# Patient Record
Sex: Female | Born: 1970 | Race: Black or African American | Hispanic: No | Marital: Single | Smoking: Former smoker
Health system: Southern US, Community
[De-identification: ages and names within clinical notes are randomized; demographics above are authoritative.]

## PROBLEM LIST (undated history)

## (undated) DIAGNOSIS — I10 Essential (primary) hypertension: Secondary | ICD-10-CM

---

## 1999-04-02 ENCOUNTER — Other Ambulatory Visit: Admission: RE | Admit: 1999-04-02 | Discharge: 1999-04-02 | Payer: Self-pay | Admitting: Obstetrics and Gynecology

## 2000-03-23 ENCOUNTER — Other Ambulatory Visit: Admission: RE | Admit: 2000-03-23 | Discharge: 2000-03-23 | Payer: Self-pay | Admitting: Obstetrics and Gynecology

## 2000-10-08 ENCOUNTER — Other Ambulatory Visit: Admission: RE | Admit: 2000-10-08 | Discharge: 2000-10-08 | Payer: Self-pay | Admitting: Obstetrics and Gynecology

## 2000-10-08 ENCOUNTER — Encounter (INDEPENDENT_AMBULATORY_CARE_PROVIDER_SITE_OTHER): Payer: Self-pay | Admitting: Specialist

## 2001-04-14 ENCOUNTER — Other Ambulatory Visit: Admission: RE | Admit: 2001-04-14 | Discharge: 2001-04-14 | Payer: Self-pay | Admitting: *Deleted

## 2002-05-27 ENCOUNTER — Other Ambulatory Visit: Admission: RE | Admit: 2002-05-27 | Discharge: 2002-05-27 | Payer: Self-pay | Admitting: *Deleted

## 2003-05-08 ENCOUNTER — Other Ambulatory Visit: Admission: RE | Admit: 2003-05-08 | Discharge: 2003-05-08 | Payer: Self-pay | Admitting: Obstetrics and Gynecology

## 2006-07-29 ENCOUNTER — Ambulatory Visit: Payer: Self-pay | Admitting: Internal Medicine

## 2006-07-29 LAB — CONVERTED CEMR LAB
Basophils Absolute: 0 10*3/uL (ref 0.0–0.1)
Basophils Relative: 0.3 % (ref 0.0–1.0)
Eosinophil percent: 2 % (ref 0.0–5.0)
Folate: >20 ng/mL
HCT: 41.2 % (ref 36.0–46.0)
Hemoglobin: 13.7 g/dL (ref 12.0–15.0)
Lymphocytes Relative: 36.3 % (ref 12.0–46.0)
MCHC: 33.2 g/dL (ref 30.0–36.0)
MCV: 91.7 fL (ref 78.0–100.0)
Monocytes Absolute: 0.6 10*3/uL (ref 0.2–0.7)
Monocytes Relative: 8.7 % (ref 3.0–11.0)
Neutro Abs: 4 10*3/uL (ref 1.4–7.7)
Neutrophils Relative %: 52.7 % (ref 43.0–77.0)
Platelets: 202 10*3/uL (ref 150–400)
RBC: 4.49 M/uL (ref 3.87–5.11)
RDW: 12.5 % (ref 11.5–14.6)
Vitamin B-12: 362 pg/mL (ref 211–911)
WBC: 7.3 10*3/uL (ref 4.5–10.5)

## 2006-09-09 ENCOUNTER — Ambulatory Visit: Payer: Self-pay | Admitting: Internal Medicine

## 2007-08-10 DIAGNOSIS — I10 Essential (primary) hypertension: Secondary | ICD-10-CM | POA: Insufficient documentation

## 2007-08-10 DIAGNOSIS — R209 Unspecified disturbances of skin sensation: Secondary | ICD-10-CM | POA: Insufficient documentation

## 2008-07-10 ENCOUNTER — Encounter: Admission: RE | Admit: 2008-07-10 | Discharge: 2008-08-09 | Payer: Self-pay | Admitting: Internal Medicine

## 2010-04-03 ENCOUNTER — Ambulatory Visit (HOSPITAL_COMMUNITY): Admission: RE | Admit: 2010-04-03 | Discharge: 2010-04-03 | Payer: Self-pay | Admitting: Obstetrics and Gynecology

## 2010-09-10 ENCOUNTER — Ambulatory Visit
Admission: RE | Admit: 2010-09-10 | Discharge: 2010-09-10 | Payer: Self-pay | Source: Home / Self Care | Attending: Obstetrics and Gynecology | Admitting: Obstetrics and Gynecology

## 2010-11-25 LAB — BASIC METABOLIC PANEL
BUN: 23 mg/dL (ref 6–23)
Creatinine, Ser: 0.77 mg/dL (ref 0.4–1.2)
GFR calc Af Amer: >60 mL/min (ref >60)
GFR calc non Af Amer: >60 mL/min (ref >60)

## 2010-11-25 LAB — CBC
Platelets: 177 10*3/uL (ref 150–400)
RBC: 3.75 MIL/uL — ABNORMAL LOW (ref 3.87–5.11)
RDW: 12.7 % (ref 11.5–15.5)
WBC: 5.7 10*3/uL (ref 4.0–10.5)

## 2010-11-25 LAB — HCG, SERUM, QUALITATIVE: Preg, Serum: NEGATIVE

## 2010-11-30 LAB — CBC
Hemoglobin: 13.5 g/dL (ref 12.0–15.0)
MCH: 31.1 pg (ref 26.0–34.0)
Platelets: 189 10*3/uL (ref 150–400)
RBC: 4.35 MIL/uL (ref 3.87–5.11)
WBC: 5.9 10*3/uL (ref 4.0–10.5)

## 2010-11-30 LAB — PREGNANCY, URINE: Preg Test, Ur: NEGATIVE

## 2013-04-13 DIAGNOSIS — R350 Frequency of micturition: Secondary | ICD-10-CM | POA: Insufficient documentation

## 2013-04-13 DIAGNOSIS — R3915 Urgency of urination: Secondary | ICD-10-CM | POA: Insufficient documentation

## 2013-05-25 DIAGNOSIS — N3010 Interstitial cystitis (chronic) without hematuria: Secondary | ICD-10-CM | POA: Insufficient documentation

## 2013-10-10 ENCOUNTER — Other Ambulatory Visit (HOSPITAL_BASED_OUTPATIENT_CLINIC_OR_DEPARTMENT_OTHER): Payer: Self-pay | Admitting: Obstetrics and Gynecology

## 2013-10-10 DIAGNOSIS — Z1231 Encounter for screening mammogram for malignant neoplasm of breast: Secondary | ICD-10-CM

## 2013-10-17 ENCOUNTER — Ambulatory Visit (HOSPITAL_BASED_OUTPATIENT_CLINIC_OR_DEPARTMENT_OTHER): Payer: Self-pay

## 2013-10-19 ENCOUNTER — Ambulatory Visit (HOSPITAL_BASED_OUTPATIENT_CLINIC_OR_DEPARTMENT_OTHER)
Admission: RE | Admit: 2013-10-19 | Discharge: 2013-10-19 | Disposition: A | Discharge status: Home / Self Care | Payer: BC Managed Care – PPO | Source: Ambulatory Visit | Attending: Obstetrics and Gynecology | Admitting: Obstetrics and Gynecology

## 2013-10-19 DIAGNOSIS — Z1231 Encounter for screening mammogram for malignant neoplasm of breast: Secondary | ICD-10-CM | POA: Insufficient documentation

## 2014-02-22 ENCOUNTER — Encounter (INDEPENDENT_AMBULATORY_CARE_PROVIDER_SITE_OTHER): Payer: BC Managed Care – PPO | Admitting: General Surgery

## 2014-03-07 ENCOUNTER — Encounter (INDEPENDENT_AMBULATORY_CARE_PROVIDER_SITE_OTHER): Payer: BC Managed Care – PPO | Admitting: General Surgery

## 2014-04-17 ENCOUNTER — Ambulatory Visit (INDEPENDENT_AMBULATORY_CARE_PROVIDER_SITE_OTHER): Payer: BC Managed Care – PPO | Admitting: General Surgery

## 2018-01-18 DIAGNOSIS — E785 Hyperlipidemia, unspecified: Secondary | ICD-10-CM | POA: Insufficient documentation

## 2018-09-01 DIAGNOSIS — G5620 Lesion of ulnar nerve, unspecified upper limb: Secondary | ICD-10-CM | POA: Insufficient documentation

## 2020-05-08 ENCOUNTER — Encounter (HOSPITAL_BASED_OUTPATIENT_CLINIC_OR_DEPARTMENT_OTHER): Payer: Self-pay

## 2020-05-08 ENCOUNTER — Other Ambulatory Visit: Payer: Self-pay

## 2020-05-08 ENCOUNTER — Emergency Department (HOSPITAL_BASED_OUTPATIENT_CLINIC_OR_DEPARTMENT_OTHER)
Admission: EM | Admit: 2020-05-08 | Discharge: 2020-05-09 | Disposition: A | Discharge status: Home / Self Care | Payer: BC Managed Care – PPO | Attending: Emergency Medicine | Admitting: Emergency Medicine

## 2020-05-08 ENCOUNTER — Emergency Department (HOSPITAL_BASED_OUTPATIENT_CLINIC_OR_DEPARTMENT_OTHER): Payer: BC Managed Care – PPO

## 2020-05-08 DIAGNOSIS — R0602 Shortness of breath: Secondary | ICD-10-CM | POA: Insufficient documentation

## 2020-05-08 DIAGNOSIS — R0789 Other chest pain: Secondary | ICD-10-CM

## 2020-05-08 DIAGNOSIS — R002 Palpitations: Secondary | ICD-10-CM | POA: Diagnosis present

## 2020-05-08 DIAGNOSIS — R072 Precordial pain: Secondary | ICD-10-CM | POA: Diagnosis not present

## 2020-05-08 DIAGNOSIS — E876 Hypokalemia: Secondary | ICD-10-CM | POA: Diagnosis not present

## 2020-05-08 DIAGNOSIS — Z20822 Contact with and (suspected) exposure to covid-19: Secondary | ICD-10-CM | POA: Diagnosis not present

## 2020-05-08 DIAGNOSIS — I10 Essential (primary) hypertension: Secondary | ICD-10-CM | POA: Diagnosis not present

## 2020-05-08 HISTORY — DX: Essential (primary) hypertension: I10

## 2020-05-08 LAB — BASIC METABOLIC PANEL
Anion gap: 9 (ref 5–15)
BUN: 10 mg/dL (ref 6–20)
CO2: 25 mmol/L (ref 22–32)
Calcium: 8.7 mg/dL — ABNORMAL LOW (ref 8.9–10.3)
Chloride: 104 mmol/L (ref 98–111)
Creatinine, Ser: 0.72 mg/dL (ref 0.44–1.00)
GFR calc Af Amer: >60 mL/min (ref >60)
GFR calc non Af Amer: >60 mL/min (ref >60)
Glucose, Bld: 102 mg/dL — ABNORMAL HIGH (ref 70–99)
Potassium: 2.8 mmol/L — ABNORMAL LOW (ref 3.5–5.1)
Sodium: 138 mmol/L (ref 135–145)

## 2020-05-08 LAB — SARS CORONAVIRUS 2 BY RT PCR (HOSPITAL ORDER, PERFORMED IN ~~LOC~~ HOSPITAL LAB): SARS Coronavirus 2: NEGATIVE

## 2020-05-08 LAB — CBC
HCT: 40.8 % (ref 36.0–46.0)
Hemoglobin: 13.6 g/dL (ref 12.0–15.0)
MCH: 29.3 pg (ref 26.0–34.0)
MCHC: 33.3 g/dL (ref 30.0–36.0)
MCV: 87.9 fL (ref 80.0–100.0)
Platelets: 254 10*3/uL (ref 150–400)
RBC: 4.64 MIL/uL (ref 3.87–5.11)
RDW: 12.5 % (ref 11.5–15.5)
WBC: 6.4 10*3/uL (ref 4.0–10.5)
nRBC: 0 % (ref 0.0–0.2)

## 2020-05-08 LAB — TROPONIN I (HIGH SENSITIVITY)
Troponin I (High Sensitivity): 3 ng/L (ref <18)
Troponin I (High Sensitivity): 3 ng/L (ref <18)

## 2020-05-08 NOTE — ED Notes (Signed)
Patient transported to X-ray 

## 2020-05-08 NOTE — ED Triage Notes (Signed)
Pt arrives ambulatory to ED with c/o intermittent CP and fluttering in her chest over last week.

## 2020-05-09 MED ORDER — POTASSIUM CHLORIDE 10 MEQ/100ML IV SOLN: 10.0000 meq | INTRAVENOUS | Status: DC

## 2020-05-09 MED ORDER — POTASSIUM CHLORIDE 20 MEQ/15ML (10%) PO SOLN
20.0000 meq | Freq: Once | ORAL | Status: AC
  Administered 2020-05-09: 20 meq via ORAL
  Filled 2020-05-09: qty 15

## 2020-05-09 MED ORDER — POTASSIUM CHLORIDE CRYS ER 20 MEQ PO TBCR: 20.0000 meq | EXTENDED_RELEASE_TABLET | Freq: Two times a day (BID) | ORAL | 0 refills | Status: AC

## 2020-05-09 MED ORDER — POTASSIUM CHLORIDE CRYS ER 20 MEQ PO TBCR
40.0000 meq | EXTENDED_RELEASE_TABLET | Freq: Once | ORAL | Status: AC
  Administered 2020-05-09: 40 meq via ORAL
  Filled 2020-05-09: qty 2

## 2020-05-09 NOTE — ED Provider Notes (Signed)
[facility] Provider Note: Lowella Dell, MD, FACEP  CSN: [id number] MRN: [id number] ARRIVAL: 05/08/20 at 2001 ROOM: [facility]06/[facility]06   CHIEF COMPLAINT  Palpitations   HISTORY OF PRESENT ILLNESS  05/09/20 1:19 AM Felicia Bell is a 49 y.o. female who has had intermittent palpitations for the past week.  She describes them as a sensation that her heart is beating rapidly or fluttering.  She has not sure if anything makes it better or worse but states it feels like anxiety.  She has some shortness of breath when these episodes occur.  She has also been having sharp, intermittent, brief chest pains in the sternal area radiating to her back.  These occur independently of the palpitations and she has been having these "for a long time".  It was the palpitations that caused her to come to the ED.  When the pains occur she rates them as a 3 out of 10.  Nothing seems to bring the chest pains on.   Past Medical History:  Diagnosis Date  . Hypertension     History reviewed. No pertinent surgical history.  No family history on file.  Social History   Tobacco Use  . Smoking status: Never Smoker  . Smokeless tobacco: Never Used  Substance Use Topics  . Alcohol use: Never  . Drug use: Never    Prior to Admission medications   Medication Sig Start Date End Date Taking? Authorizing Provider  potassium chloride SA (KLOR-CON) 20 MEQ tablet Take 1 tablet (20 mEq total) by mouth 2 (two) times daily for 7 days. 05/09/20 05/16/20  Yaroslav Gombos, MD    Allergies Morphine   REVIEW OF SYSTEMS  Negative except as noted here or in the History of Present Illness.   PHYSICAL EXAMINATION  Initial Vital Signs Blood pressure 138/88, pulse 79, temperature 99 F (37.2 C), temperature source Oral, resp. rate 17, height 5' (1.524 m), weight 75.8 kg, SpO2 99 %.  Examination General: Well-developed, well-nourished female in no acute distress; appearance consistent with age of record HENT:  normocephalic; atraumatic Eyes: pupils equal, round and reactive to light; extraocular muscles intact Neck: supple Heart: regular rate and rhythm Lungs: clear to auscultation bilaterally Abdomen: soft; nondistended; nontender; bowel sounds present Extremities: No deformity; full range of motion; pulses normal Neurologic: Awake, alert and oriented; motor function intact in all extremities and symmetric; no facial droop Skin: Warm and dry Psychiatric: Normal mood and affect   RESULTS  Summary of this visit's results, reviewed and interpreted by myself:   EKG Interpretation  Date/Time:  Tuesday May 08 2020 20:12:37 EDT Ventricular Rate:  84 PR Interval:  148 QRS Duration: 72 QT Interval:  372 QTC Calculation: 439 R Axis:   26 Text Interpretation: Normal sinus rhythm ST & T wave abnormality, consider inferior ischemia Abnormal ECG Artifact No previous ECGs available Confirmed by Paula Libra ([id number]) on 05/08/2020 10:47:37 PM      Laboratory Studies: Results for orders placed or performed during the hospital encounter of 05/08/20 (from the past 24 hour(s))  Basic metabolic panel     Status: Abnormal   Collection Time: 05/08/20  8:24 PM  Result Value Ref Range   Sodium 138 135 - 145 mmol/L   Potassium 2.8 (L) 3.5 - 5.1 mmol/L   Chloride 104 98 - 111 mmol/L   CO2 25 22 - 32 mmol/L   Glucose, Bld 102 (H) 70 - 99 mg/dL   BUN 10 6 - 20 mg/dL   Creatinine, Ser  0.72 0.44 - 1.00 mg/dL   Calcium 8.7 (L) 8.9 - 10.3 mg/dL   GFR calc non Af Amer >60 >60 mL/min   GFR calc Af Amer >60 >60 mL/min   Anion gap 9 5 - 15  CBC     Status: None   Collection Time: 05/08/20  8:24 PM  Result Value Ref Range   WBC 6.4 4.0 - 10.5 K/uL   RBC 4.64 3.87 - 5.11 MIL/uL   Hemoglobin 13.6 12.0 - 15.0 g/dL   HCT 35.3 36 - 46 %   MCV 87.9 80.0 - 100.0 fL   MCH 29.3 26.0 - 34.0 pg   MCHC 33.3 30.0 - 36.0 g/dL   RDW 61.4 43.1 - 54.0 %   Platelets 254 150 - 400 K/uL   nRBC 0.0 0.0 - 0.2 %  Troponin I  (High Sensitivity)     Status: None   Collection Time: 05/08/20  8:24 PM  Result Value Ref Range   Troponin I (High Sensitivity) 3 <18 ng/L  SARS Coronavirus 2 by RT PCR (hospital order, performed in [facility] hospital lab) Nasopharyngeal Nasopharyngeal Swab     Status: None   Collection Time: 05/08/20  8:24 PM   Specimen: Nasopharyngeal Swab  Result Value Ref Range   SARS Coronavirus 2 NEGATIVE NEGATIVE  Troponin I (High Sensitivity)     Status: None   Collection Time: 05/08/20 10:57 PM  Result Value Ref Range   Troponin I (High Sensitivity) 3 <18 ng/L   Imaging Studies: DG Chest 2 View  Result Date: 05/08/2020 CLINICAL DATA:  Chest pain EXAM: CHEST - 2 VIEW COMPARISON:  None. FINDINGS: Heart and mediastinal contours are within normal limits. No focal opacities or effusions. No acute bony abnormality. IMPRESSION: No active cardiopulmonary disease. Electronically Signed   By: Charlett Nose M.D.   On: 05/08/2020 20:39    ED COURSE and MDM  Nursing notes, initial and subsequent vitals signs, including pulse oximetry, reviewed and interpreted by myself.  Vitals:   05/08/20 2230 05/08/20 2336 05/09/20 0000 05/09/20 0100  BP: 133/89 (!) 143/99 138/88 (!) 139/95  Pulse: 78 72 79 72  Resp: 17 16 17 18   Temp:      TempSrc:      SpO2: 100% 97% 99% 100%  Weight:      Height:       Medications  potassium chloride SA (KLOR-CON) CR tablet 40 mEq (has no administration in time range)  potassium chloride 20 MEQ/15ML (10%) solution 20 mEq (has no administration in time range)    1:30 AM The patient's troponins are both three which is well within the normal range.  Her rhythm strip for her entire time on the monitor was reviewed and she had no arrhythmias or ectopy.  She is asymptomatic presently.  I believe she is low risk for ACS (HEART score 3) and can be discharged.  She has a cardiologist with whom she can follow-up in [place].  She was advised she needs to contact the cardiologist  regarding palpitations.  We will treat her hypokalemia.  She has a history of same.  PROCEDURES  Procedures   ED DIAGNOSES     ICD-10-CM   1. Palpitations  R00.2   2. Atypical chest pain  R07.89   3. Hypokalemia  E87.6        Aeralyn Barna, [place], MD 05/09/20 [phone]

## 2020-05-09 NOTE — Discharge Instructions (Addendum)
Follow-up with your cardiologist in [place] regarding palpitations.  You may need to be placed on a long-term monitor to evaluate your heart rhythm.

## 2020-11-20 DIAGNOSIS — R002 Palpitations: Secondary | ICD-10-CM | POA: Insufficient documentation

## 2021-01-01 DIAGNOSIS — N915 Oligomenorrhea, unspecified: Secondary | ICD-10-CM | POA: Insufficient documentation

## 2021-01-01 DIAGNOSIS — N83209 Unspecified ovarian cyst, unspecified side: Secondary | ICD-10-CM | POA: Insufficient documentation

## 2021-01-01 DIAGNOSIS — E559 Vitamin D deficiency, unspecified: Secondary | ICD-10-CM | POA: Insufficient documentation

## 2021-01-01 DIAGNOSIS — R87612 Low grade squamous intraepithelial lesion on cytologic smear of cervix (LGSIL): Secondary | ICD-10-CM | POA: Insufficient documentation

## 2021-01-01 DIAGNOSIS — R7989 Other specified abnormal findings of blood chemistry: Secondary | ICD-10-CM | POA: Insufficient documentation

## 2021-01-01 DIAGNOSIS — D239 Other benign neoplasm of skin, unspecified: Secondary | ICD-10-CM | POA: Insufficient documentation

## 2021-01-01 DIAGNOSIS — F32A Depression, unspecified: Secondary | ICD-10-CM | POA: Insufficient documentation

## 2021-01-01 DIAGNOSIS — N952 Postmenopausal atrophic vaginitis: Secondary | ICD-10-CM | POA: Insufficient documentation

## 2021-01-01 DIAGNOSIS — N906 Unspecified hypertrophy of vulva: Secondary | ICD-10-CM | POA: Insufficient documentation

## 2021-01-01 DIAGNOSIS — N750 Cyst of Bartholin's gland: Secondary | ICD-10-CM | POA: Insufficient documentation

## 2021-01-01 DIAGNOSIS — R6882 Decreased libido: Secondary | ICD-10-CM | POA: Insufficient documentation

## 2021-01-01 DIAGNOSIS — N94819 Vulvodynia, unspecified: Secondary | ICD-10-CM | POA: Insufficient documentation

## 2021-01-01 DIAGNOSIS — L659 Nonscarring hair loss, unspecified: Secondary | ICD-10-CM | POA: Insufficient documentation

## 2021-01-01 DIAGNOSIS — Z9851 Tubal ligation status: Secondary | ICD-10-CM | POA: Insufficient documentation

## 2021-03-07 DIAGNOSIS — G8929 Other chronic pain: Secondary | ICD-10-CM | POA: Insufficient documentation

## 2021-03-07 DIAGNOSIS — M62838 Other muscle spasm: Secondary | ICD-10-CM | POA: Insufficient documentation

## 2021-03-07 DIAGNOSIS — M461 Sacroiliitis, not elsewhere classified: Secondary | ICD-10-CM | POA: Insufficient documentation

## 2021-03-28 DIAGNOSIS — M47816 Spondylosis without myelopathy or radiculopathy, lumbar region: Secondary | ICD-10-CM | POA: Insufficient documentation

## 2021-08-07 DIAGNOSIS — Z6833 Body mass index (BMI) 33.0-33.9, adult: Secondary | ICD-10-CM | POA: Insufficient documentation

## 2021-12-13 IMAGING — CR DG CHEST 2V
2 series · 2 of 2 positions shown · non-contrast
Comparison: None.

CLINICAL DATA: Chest pain

EXAM:
CHEST - 2 VIEW

[w chest pa]
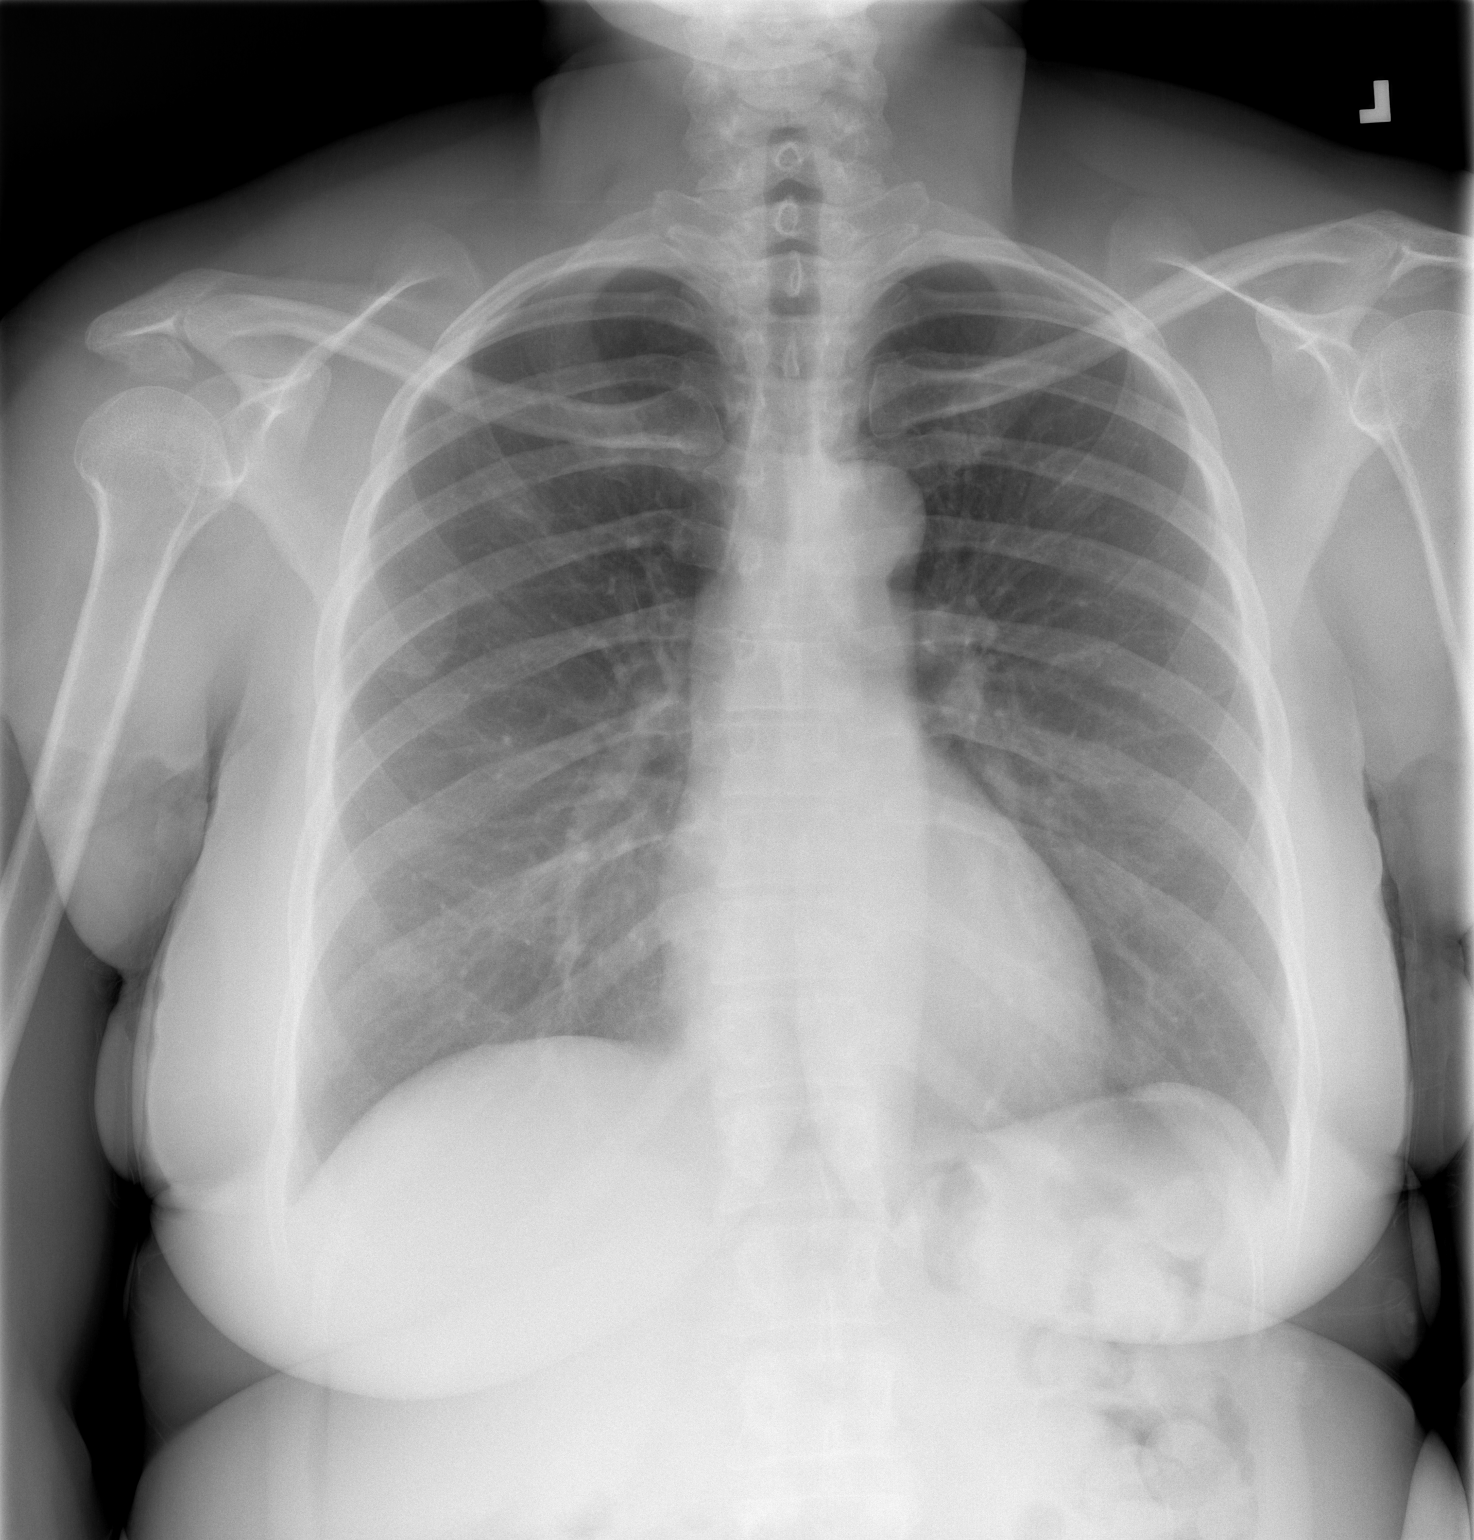

[w chest lat]
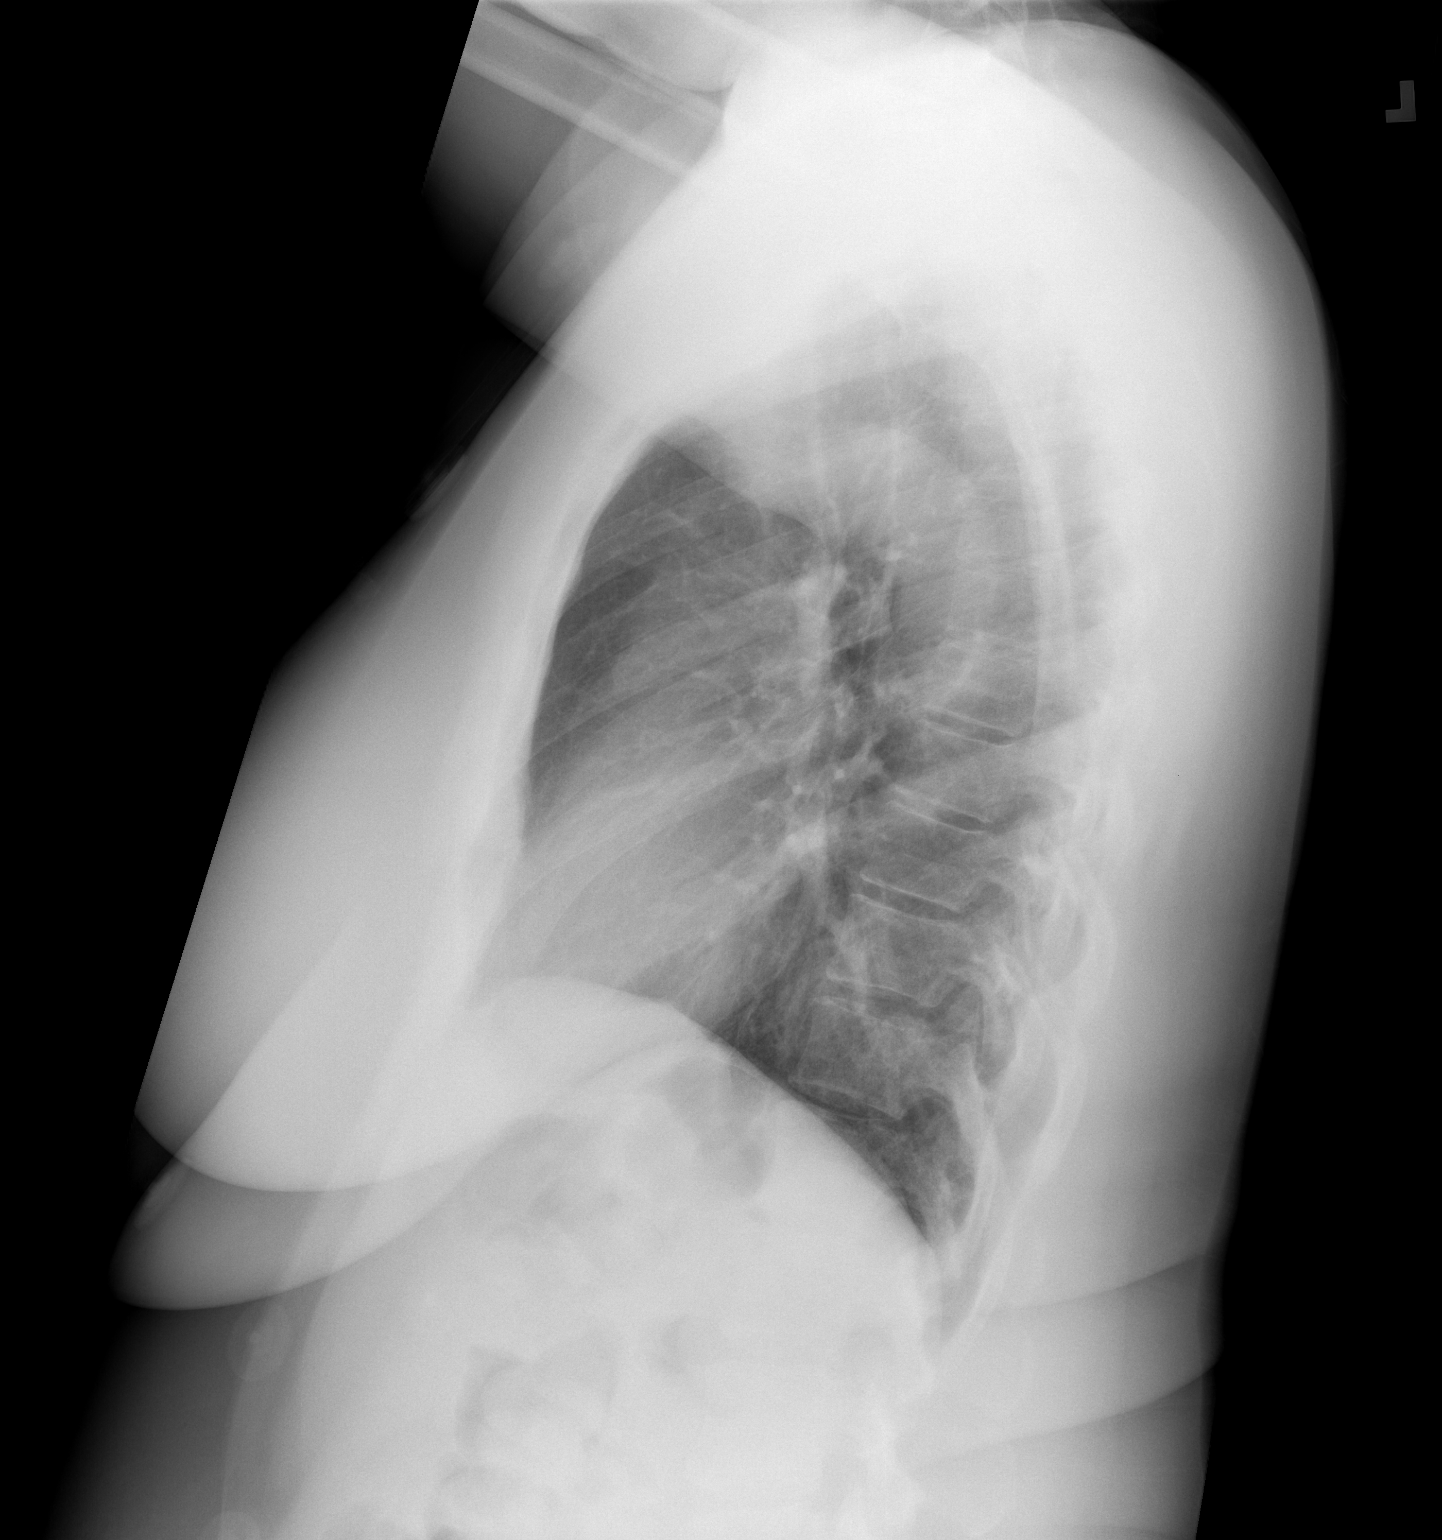

[2 of 2 positions shown; findings below may reference images not displayed]

FINDINGS: Heart and mediastinal contours are within normal limits. No focal
opacities or effusions. No acute bony abnormality.
IMPRESSION: No active cardiopulmonary disease.

## 2023-03-14 ENCOUNTER — Emergency Department (HOSPITAL_BASED_OUTPATIENT_CLINIC_OR_DEPARTMENT_OTHER): Payer: BC Managed Care – PPO

## 2023-03-14 ENCOUNTER — Emergency Department (HOSPITAL_BASED_OUTPATIENT_CLINIC_OR_DEPARTMENT_OTHER)
Admission: EM | Admit: 2023-03-14 | Discharge: 2023-03-14 | Disposition: A | Discharge status: Home / Self Care | Payer: BC Managed Care – PPO | Attending: Emergency Medicine | Admitting: Emergency Medicine

## 2023-03-14 ENCOUNTER — Encounter (HOSPITAL_BASED_OUTPATIENT_CLINIC_OR_DEPARTMENT_OTHER): Payer: Self-pay

## 2023-03-14 DIAGNOSIS — R1010 Upper abdominal pain, unspecified: Secondary | ICD-10-CM | POA: Diagnosis present

## 2023-03-14 LAB — COMPREHENSIVE METABOLIC PANEL
ALT: 15 U/L (ref 0–44)
AST: 18 U/L (ref 15–41)
Albumin: 4 g/dL (ref 3.5–5.0)
Alkaline Phosphatase: 73 U/L (ref 38–126)
Anion gap: 10 (ref 5–15)
BUN: 10 mg/dL (ref 6–20)
CO2: 23 mmol/L (ref 22–32)
Calcium: 8.9 mg/dL (ref 8.9–10.3)
Chloride: 103 mmol/L (ref 98–111)
Creatinine, Ser: 0.77 mg/dL (ref 0.44–1.00)
GFR, Estimated: >60 mL/min (ref >60)
Glucose, Bld: 93 mg/dL (ref 70–99)
Potassium: 3.4 mmol/L — ABNORMAL LOW (ref 3.5–5.1)
Sodium: 136 mmol/L (ref 135–145)
Total Bilirubin: 0.9 mg/dL (ref 0.3–1.2)
Total Protein: 7.6 g/dL (ref 6.5–8.1)

## 2023-03-14 LAB — CBC
HCT: 39.5 % (ref 36.0–46.0)
Hemoglobin: 13.1 g/dL (ref 12.0–15.0)
MCH: 28.9 pg (ref 26.0–34.0)
MCHC: 33.2 g/dL (ref 30.0–36.0)
MCV: 87 fL (ref 80.0–100.0)
Platelets: 249 10*3/uL (ref 150–400)
RBC: 4.54 MIL/uL (ref 3.87–5.11)
RDW: 12.8 % (ref 11.5–15.5)
WBC: 5.5 10*3/uL (ref 4.0–10.5)
nRBC: 0 % (ref 0.0–0.2)

## 2023-03-14 LAB — LIPASE, BLOOD: Lipase: 30 U/L (ref 11–51)

## 2023-03-14 LAB — TROPONIN I (HIGH SENSITIVITY): Troponin I (High Sensitivity): <2 ng/L (ref <18)

## 2023-03-14 MED ORDER — SUCRALFATE 1 G PO TABS: 1.0000 g | ORAL_TABLET | Freq: Three times a day (TID) | ORAL | 0 refills | Status: DC

## 2023-03-14 MED ORDER — FAMOTIDINE 20 MG PO TABS: 20.0000 mg | ORAL_TABLET | Freq: Two times a day (BID) | ORAL | 0 refills | Status: AC

## 2023-03-14 NOTE — ED Provider Notes (Signed)
[place] EMERGENCY DEPARTMENT AT [facility] Provider Note   CSN: [id number] Arrival date & time: 03/14/23  1304     History  Chief Complaint  Patient presents with   Abdominal Pain   Chest Pain    Felicia Bell is a 52 y.o. female.  Patient presents to the emergency department for evaluation of bilateral upper abdominal and lower rib pain which started about 6 days ago.  Symptoms wax and wane throughout the day.  She has not really noticed that it is worse with eating or drinking but she does notice it is worse when she lays down at night and early in the morning.  Patient had similar symptoms in March and states that her physician ordered an ultrasound of her abdomen.  Review of records show that this was negative.  The following day, patient had a nuclear stress test which was normal.  She recently had MRI brain and MRA of the head and neck for dizziness.  She states that she takes famotidine.  She is not currently on a PPI.  No vomiting.  She has had some nausea.  No chest pain or shortness of breath.  No urinary symptoms or diarrhea.  She denies any injuries or repetitive upper body motions.  She states that she has GI follow-up next month.       Home Medications Prior to Admission medications   Medication Sig Start Date End Date Taking? Authorizing Provider  famotidine (PEPCID) 20 MG tablet Take 1 tablet (20 mg total) by mouth 2 (two) times daily. 03/14/23  Yes Renne Crigler, PA-C  sucralfate (CARAFATE) 1 g tablet Take 1 tablet (1 g total) by mouth 4 (four) times daily -  with meals and at bedtime. 03/14/23  Yes Renne Crigler, PA-C  potassium chloride SA (KLOR-CON) 20 MEQ tablet Take 1 tablet (20 mEq total) by mouth 2 (two) times daily for 7 days. 05/09/20 05/16/20  Molpus, John, MD      Allergies    Morphine    Review of Systems   Review of Systems  Physical Exam Updated Vital Signs BP (!) 146/99   Pulse 86   Temp (!) 97.2 F (36.2 C) (Oral)   Resp 18    SpO2 100%   Physical Exam Vitals and nursing note reviewed.  Constitutional:      General: She is not in acute distress.    Appearance: She is well-developed.  HENT:     Head: Normocephalic and atraumatic.     Right Ear: External ear normal.     Left Ear: External ear normal.     Nose: Nose normal.  Eyes:     Conjunctiva/sclera: Conjunctivae normal.  Cardiovascular:     Rate and Rhythm: Normal rate and regular rhythm.     Heart sounds: No murmur heard. Pulmonary:     Effort: No respiratory distress.     Breath sounds: No wheezing, rhonchi or rales.  Abdominal:     Palpations: Abdomen is soft.     Tenderness: There is no abdominal tenderness. There is no guarding or rebound.  Musculoskeletal:     Cervical back: Normal range of motion and neck supple.     Right lower leg: No edema.     Left lower leg: No edema.  Skin:    General: Skin is warm and dry.     Findings: No rash.  Neurological:     General: No focal deficit present.     Mental Status: She is  alert. Mental status is at baseline.     Motor: No weakness.  Psychiatric:        Mood and Affect: Mood normal.     ED Results / Procedures / Treatments   Labs (all labs ordered are listed, but only abnormal results are displayed) Labs Reviewed  COMPREHENSIVE METABOLIC PANEL - Abnormal; Notable for the following components:      Result Value   Potassium 3.4 (*)    All other components within normal limits  LIPASE, BLOOD  CBC  TROPONIN I (HIGH SENSITIVITY)    EKG EKG Interpretation Date/Time:  Saturday March 14 2023 13:14:10 EDT Ventricular Rate:  84 PR Interval:  145 QRS Duration:  87 QT Interval:  368 QTC Calculation: 435 R Axis:   32  Text Interpretation: Sinus rhythm Probable left atrial enlargement Borderline T abnormalities, diffuse leads No significant change since last tracing Confirmed by Gwyneth Sprout ([id number]) on 03/14/2023 3:59:19 PM  Radiology DG Chest 2 View  Result Date:  03/14/2023 CLINICAL DATA:  Chest pain EXAM: CHEST - 2 VIEW COMPARISON:  None Available. FINDINGS: The cardiomediastinal silhouette is unchanged in contour. No focal pulmonary opacity. No pleural effusion or pneumothorax. The visualized upper abdomen is unremarkable. No acute osseous abnormality. IMPRESSION: No active cardiopulmonary disease. Electronically Signed   By: Jacob Moores M.D.   On: 03/14/2023 15:13    Procedures Procedures    Medications Ordered in ED Medications - No data to display  ED Course/ Medical Decision Making/ A&P    Patient seen and examined. History obtained directly from patient.  I reviewed the patient's outpatient workup over the past 6 months including MRIs, ultrasound.  Work-up including labs, imaging, EKG ordered in triage, if performed, were reviewed.    Labs/EKG: Independently reviewed and interpreted.  This included: CBC unremarkable; CMP unremarkable except for minimally low potassium at 3.4; lipase normal; troponin less than 2.  Imaging: Independently visualized and interpreted.  This included: Chest x-ray, agree negative.  Medications/Fluids: None ordered  Most recent vital signs reviewed and are as follows: BP (!) 146/99   Pulse 86   Temp (!) 97.2 F (36.2 C) (Oral)   Resp 18   SpO2 100%   Initial impression: Bilateral upper abdominal/lower chest pain.  Workup is reassuring here.  Differentials include peptic ulcer disease, gastritis, duodenitis, lower chest wall pain, costochondritis.  Low concern for ACS, PE at this time.  I do not feel strongly that patient requires imaging today with CT or repeat ultrasound.  I will add Carafate onto her treatment regimen  Plan: Discharge to home.   Prescriptions written for: Carafate, famotidine  Other home care instructions discussed: Parke Simmers diet  ED return instructions discussed: The patient was urged to return to the Emergency Department immediately with worsening of current symptoms, worsening  abdominal pain, persistent vomiting, blood noted in stools, fever, or any other concerns. The patient verbalized understanding.   Follow-up instructions discussed: Patient encouraged to follow-up with their PCP in 5 days, GI as planned.                             Medical Decision Making Amount and/or Complexity of Data Reviewed Labs: ordered. Radiology: ordered.  Risk Prescription drug management.   For this patient's complaint of abdominal pain, the following conditions were considered on the differential diagnosis: gastritis/PUD, enteritis/duodenitis, appendicitis, cholelithiasis/cholecystitis, cholangitis, pancreatitis, ruptured viscus, colitis, diverticulitis, small/large bowel obstruction, proctitis, cystitis, pyelonephritis, ureteral colic, aortic dissection,  aortic aneurysm. In women, ectopic pregnancy, pelvic inflammatory disease, ovarian cysts, and tubo-ovarian abscess were also considered. Atypical chest etiologies were also considered including ACS, PE, and pneumonia.  For this patient's complaint of chest pain, the following emergent conditions were considered on the differential diagnosis: acute coronary syndrome, pulmonary embolism, pneumothorax, myocarditis, pericardial tamponade, aortic dissection, thoracic aortic aneurysm complication, esophageal perforation.   Other causes were also considered including: gastroesophageal reflux disease, musculoskeletal pain including costochondritis, pneumonia/pleurisy, herpes zoster, pericarditis.  In regards to possibility of ACS, patient has atypical features of pain, non-ischemic and unchanged EKG and negative troponin(s). Heart score was calculated to be 2.   In regards to possibility of PE, symptoms are atypical for PE and risk profile is low, making PE low likelihood.   The patient's vital signs, pertinent lab work and imaging were reviewed and interpreted as discussed in the ED course. Hospitalization was considered for further  testing, treatments, or serial exams/observation. However as patient is well-appearing, has a stable exam, and reassuring studies today, I do not feel that they warrant admission at this time. This plan was discussed with the patient who verbalizes agreement and comfort with this plan and seems reliable and able to return to the Emergency Department with worsening or changing symptoms.           Final Clinical Impression(s) / ED Diagnoses Final diagnoses:  Upper abdominal pain    Rx / DC Orders ED Discharge Orders          Ordered    sucralfate (CARAFATE) 1 g tablet  3 times daily with meals & bedtime        03/14/23 1713    famotidine (PEPCID) 20 MG tablet  2 times daily        03/14/23 1713              Renne Crigler, PA-C 03/14/23 1721    Gwyneth Sprout, MD 03/14/23 2251

## 2023-03-14 NOTE — Discharge Instructions (Addendum)
Please read and follow all provided instructions.  Your diagnoses today include:  1. Upper abdominal pain     Tests performed today include: Blood cell counts and platelets: Were normal Kidney and liver function tests: Slightly low potassium otherwise normal Pancreas function test (called lipase): Was normal Urine test to look for infection: No signs of infection Troponin and EKG: Unchanged without signs of stress on the heart or heart attack Vital signs. See below for your results today.   Medications prescribed:  Carafate - for stomach upset and to protect your stomach  Pepcid (famotidine) - antihistamine  You can find this medication over-the-counter.   DO NOT exceed:  20mg  Pepcid every 12 hours  Take any prescribed medications only as directed.  Home care instructions:  Follow any educational materials contained in this packet.  Follow-up instructions: Please follow-up with your primary care provider in the next 5 days for further evaluation of your symptoms.  Follow-up with your gastroenterologist as planned.  Return instructions:  SEEK IMMEDIATE MEDICAL ATTENTION IF: The pain does not go away or becomes severe  A temperature above 101F develops  Repeated vomiting occurs (multiple episodes)  The pain becomes localized to portions of the abdomen. The right side could possibly be appendicitis. In an adult, the left lower portion of the abdomen could be colitis or diverticulitis.  Blood is being passed in stools or vomit (bright red or black tarry stools)  You develop chest pain, difficulty breathing, dizziness or fainting, or become confused, poorly responsive, or inconsolable (young children) If you have any other emergent concerns regarding your health  Additional Information: Abdominal (belly) pain can be caused by many things. Your caregiver performed an examination and possibly ordered blood/urine tests and imaging (CT scan, x-rays, ultrasound). Many cases can be  observed and treated at home after initial evaluation in the emergency department. Even though you are being discharged home, abdominal pain can be unpredictable. Therefore, you need a repeated exam if your pain does not resolve, returns, or worsens. Most patients with abdominal pain don't have to be admitted to the hospital or have surgery, but serious problems like appendicitis and gallbladder attacks can start out as nonspecific pain. Many abdominal conditions cannot be diagnosed in one visit, so follow-up evaluations are very important.  Your vital signs today were: BP (!) 146/99   Pulse 86   Temp (!) 97.2 F (36.2 C) (Oral)   Resp 18   SpO2 100%  If your blood pressure (bp) was elevated above 135/85 this visit, please have this repeated by your doctor within one month. --------------

## 2023-03-14 NOTE — ED Triage Notes (Signed)
She c/o upper abd./thoracic pain which at times radiates into upper chest and neck. She states this began this Monday and persists. She tells me it generally increases "whenever I lay down and it gets better throughout the day when I am up and about". She cites recent "stress test" with cardiologist in April of this year. She is ambulatory and in no distress. She is breathing normally.

## 2024-02-22 DIAGNOSIS — M7912 Myalgia of auxiliary muscles, head and neck: Secondary | ICD-10-CM | POA: Insufficient documentation

## 2024-02-22 DIAGNOSIS — G43709 Chronic migraine without aura, not intractable, without status migrainosus: Secondary | ICD-10-CM | POA: Insufficient documentation

## 2024-02-22 DIAGNOSIS — J342 Deviated nasal septum: Secondary | ICD-10-CM | POA: Insufficient documentation

## 2024-02-22 DIAGNOSIS — H6991 Unspecified Eustachian tube disorder, right ear: Secondary | ICD-10-CM | POA: Insufficient documentation

## 2024-05-20 ENCOUNTER — Other Ambulatory Visit: Payer: Self-pay | Admitting: Obstetrics and Gynecology

## 2024-05-20 DIAGNOSIS — Z1231 Encounter for screening mammogram for malignant neoplasm of breast: Secondary | ICD-10-CM

## 2024-07-22 ENCOUNTER — Other Ambulatory Visit: Payer: Self-pay | Admitting: Obstetrics and Gynecology

## 2024-07-22 DIAGNOSIS — Z1231 Encounter for screening mammogram for malignant neoplasm of breast: Secondary | ICD-10-CM

## 2024-07-27 ENCOUNTER — Emergency Department (HOSPITAL_BASED_OUTPATIENT_CLINIC_OR_DEPARTMENT_OTHER)

## 2024-07-27 ENCOUNTER — Encounter (HOSPITAL_BASED_OUTPATIENT_CLINIC_OR_DEPARTMENT_OTHER): Payer: Self-pay

## 2024-07-27 ENCOUNTER — Other Ambulatory Visit: Payer: Self-pay

## 2024-07-27 ENCOUNTER — Emergency Department (HOSPITAL_BASED_OUTPATIENT_CLINIC_OR_DEPARTMENT_OTHER)
Admission: EM | Admit: 2024-07-27 | Discharge: 2024-07-27 | Disposition: A | Discharge status: Home / Self Care | Attending: Emergency Medicine | Admitting: Emergency Medicine

## 2024-07-27 DIAGNOSIS — I10 Essential (primary) hypertension: Secondary | ICD-10-CM | POA: Diagnosis not present

## 2024-07-27 DIAGNOSIS — R0789 Other chest pain: Secondary | ICD-10-CM | POA: Diagnosis present

## 2024-07-27 LAB — BASIC METABOLIC PANEL WITH GFR
Anion gap: 12 (ref 5–15)
BUN: 12 mg/dL (ref 6–20)
CO2: 23 mmol/L (ref 22–32)
Calcium: 9.1 mg/dL (ref 8.9–10.3)
Chloride: 103 mmol/L (ref 98–111)
Creatinine, Ser: 0.77 mg/dL (ref 0.44–1.00)
GFR, Estimated: >60 mL/min (ref >60)
Glucose, Bld: 96 mg/dL (ref 70–99)
Potassium: 3.9 mmol/L (ref 3.5–5.1)
Sodium: 138 mmol/L (ref 135–145)

## 2024-07-27 LAB — CBC
HCT: 43.3 % (ref 36.0–46.0)
Hemoglobin: 14.7 g/dL (ref 12.0–15.0)
MCH: 30 pg (ref 26.0–34.0)
MCHC: 33.9 g/dL (ref 30.0–36.0)
MCV: 88.4 fL (ref 80.0–100.0)
Platelets: 263 K/uL (ref 150–400)
RBC: 4.9 MIL/uL (ref 3.87–5.11)
RDW: 13.7 % (ref 11.5–15.5)
WBC: 6.2 K/uL (ref 4.0–10.5)
nRBC: 0 % (ref 0.0–0.2)

## 2024-07-27 LAB — TROPONIN T, HIGH SENSITIVITY
Troponin T High Sensitivity: <15 ng/L (ref 0–19)
Troponin T High Sensitivity: <15 ng/L (ref 0–19)

## 2024-07-27 MED ORDER — NITROGLYCERIN 0.4 MG SL SUBL
0.4000 mg | SUBLINGUAL_TABLET | SUBLINGUAL | Status: DC | PRN
Start: 2024-07-27 — End: 2024-07-27
  Administered 2024-07-27: 0.4 mg via SUBLINGUAL
  Filled 2024-07-27: qty 1

## 2024-07-27 MED ORDER — NITROGLYCERIN 0.4 MG SL SUBL: 0.4000 mg | SUBLINGUAL_TABLET | SUBLINGUAL | 0 refills | Status: AC | PRN

## 2024-07-27 MED ORDER — SUCRALFATE 1 G PO TABS: 1.0000 g | ORAL_TABLET | Freq: Three times a day (TID) | ORAL | 0 refills | Status: AC

## 2024-07-27 MED ORDER — OMEPRAZOLE 20 MG PO CPDR: 20.0000 mg | DELAYED_RELEASE_CAPSULE | Freq: Every day | ORAL | 0 refills | Status: AC

## 2024-07-27 NOTE — ED Provider Notes (Signed)
 [facility] Provider Note   CSN: [id number] Arrival date & time: 07/27/24  1208     Patient presents with: Chest Pain   Felicia Bell is a 53 y.o. female.    Chest Pain  Patient is a 53 year old female with a past medical history significant for hypertension she is not a smoker she states that she has had chest pain off and on over the past 5 to 6 years she has been told repeatedly that this is most likely reflux or esophageal spasm.  Seems that she has been seen by cardiology at [facility].  She had a echocardiogram done 02/25/2024 which showed normal EF of 60 with mild hypertrophy normal wall motion.  She tells me she had a coronary artery CT score that was normal and she had a stress test on 12/11/2022 which was normal.  She comes emergency room today with complaints of chest pain has been constant since last night she describes as generalized chest pain across her entire chest as a pressure achy sensation radiates to her left shoulder and left jaw although she has some aching in her right jaw as well although this this feels separate to her.  She states she had some mild nausea earlier but no vomiting.  Denies any shortness of breath hemoptysis unilateral or bilateral leg swelling.     Prior to Admission medications   Medication Sig Start Date End Date Taking? Authorizing Provider  nitroGLYCERIN (NITROSTAT) 0.4 MG SL tablet Place 1 tablet (0.4 mg total) under the tongue every 5 (five) minutes as needed for chest pain. 07/27/24  Yes Wilder Amodei S, PA  omeprazole (PRILOSEC) 20 MG capsule Take 1 capsule (20 mg total) by mouth daily. 07/27/24  Yes Aziah Brostrom S, PA  sucralfate  (CARAFATE ) 1 g tablet Take 1 tablet (1 g total) by mouth 4 (four) times daily -  with meals and at bedtime. 07/27/24 08/26/24 Yes Roi Jafari S, PA  famotidine  (PEPCID ) 20 MG tablet Take 1 tablet (20 mg total) by mouth 2 (two) times daily. 03/14/23   Desiderio Chew, PA-C  potassium chloride  SA (KLOR-CON ) 20 MEQ tablet Take 1 tablet (20 mEq total) by mouth 2 (two) times daily for 7 days. 05/09/20 05/16/20  Molpus, John, MD    Allergies: Morphine    Review of Systems  Cardiovascular:  Positive for chest pain.    Updated Vital Signs BP 130/88   Pulse 82   Temp 98.2 F (36.8 C) (Oral)   Resp 18   SpO2 99%   Physical Exam Vitals and nursing note reviewed.  Constitutional:      General: She is not in acute distress. HENT:     Head: Normocephalic and atraumatic.     Nose: Nose normal.     Mouth/Throat:     Mouth: Mucous membranes are moist.  Eyes:     General: No scleral icterus. Cardiovascular:     Rate and Rhythm: Normal rate and regular rhythm.     Pulses: Normal pulses.     Heart sounds: Normal heart sounds.  Pulmonary:     Effort: Pulmonary effort is normal. No respiratory distress.     Breath sounds: No wheezing.  Chest:     Chest wall: Tenderness present.  Abdominal:     Palpations: Abdomen is soft.     Tenderness: There is no abdominal tenderness. There is no guarding or rebound.  Musculoskeletal:     Cervical back: Normal range of motion.  Right lower leg: No edema.     Left lower leg: No edema.  Skin:    General: Skin is warm and dry.     Capillary Refill: Capillary refill takes less than 2 seconds.  Neurological:     Mental Status: She is alert. Mental status is at baseline.  Psychiatric:        Mood and Affect: Mood normal.        Behavior: Behavior normal.     (all labs ordered are listed, but only abnormal results are displayed) Labs Reviewed  BASIC METABOLIC PANEL WITH GFR  CBC  TROPONIN T, HIGH SENSITIVITY  TROPONIN T, HIGH SENSITIVITY    EKG: EKG Interpretation Date/Time:  Wednesday July 27 2024 12:12:59 EST Ventricular Rate:  80 PR Interval:  133 QRS Duration:  84 QT Interval:  371 QTC Calculation: 428 R Axis:   29  Text Interpretation: Sinus rhythm Borderline abnrm T, anterolateral  leads nonspecific T waves similar to Jun 2024 Confirmed by Freddi Hamilton [phone]) on 07/27/2024 1:58:53 PM  Radiology: [facility] Chest 2 View Result Date: 07/27/2024 CLINICAL DATA:  Generalized chest pain radiating to back and shoulders as well as jaw since last night. Nausea. EXAM: CHEST - 2 VIEW COMPARISON:  03/14/2023 FINDINGS: Lungs are adequately inflated and otherwise clear. Cardiomediastinal silhouette and remainder of the exam is unchanged. IMPRESSION: No active cardiopulmonary disease. Electronically Signed   By: Toribio Agreste M.D.   On: 07/27/2024 13:18     Procedures   Medications Ordered in the ED - No data to display                                   Medical Decision Making Amount and/or Complexity of Data Reviewed Labs: ordered. Radiology: ordered.  Risk Prescription drug management.   This patient presents to the ED for concern of CP, this involves a number of treatment options, and is a complaint that carries with it a moderate to high risk of complications and morbidity. A differential diagnosis was considered for the patient's symptoms which is discussed below:   The emergent causes of chest pain include: Acute coronary syndrome, tamponade, pericarditis/myocarditis, aortic dissection, pulmonary embolism, tension pneumothorax, pneumonia, and esophageal rupture.    Co morbidities: Discussed in HPI   Brief History:  Patient is a 53 year old female with a past medical history significant for hypertension she is not a smoker she states that she has had chest pain off and on over the past 5 to 6 years she has been told repeatedly that this is most likely reflux or esophageal spasm.  Seems that she has been seen by cardiology at [facility].  She had a echocardiogram done 02/25/2024 which showed normal EF of 60 with mild hypertrophy normal wall motion.  She tells me she had a coronary artery CT score that was normal and she had a stress test on 12/11/2022 which was  normal.  She comes emergency room today with complaints of chest pain has been constant since last night she describes as generalized chest pain across her entire chest as a pressure achy sensation radiates to her left shoulder and left jaw although she has some aching in her right jaw as well although this this feels separate to her.  She states she had some mild nausea earlier but no vomiting.  Denies any shortness of breath hemoptysis unilateral or bilateral leg swelling.    EMR reviewed including pt  PMHx, past surgical history and past visits to ER.   See HPI for more details   Lab Tests:   I personally reviewed all laboratory work and imaging. Metabolic panel without any acute abnormality specifically kidney function within normal limits and no significant electrolyte abnormalities. CBC without leukocytosis or significant anemia. Troponin x 2 undetectable  Imaging Studies:  NAD. I personally reviewed all imaging studies and no acute abnormality found. I agree with radiology interpretation.    Cardiac Monitoring:  The patient was maintained on a cardiac monitor.  I personally viewed and interpreted the cardiac monitored which showed an underlying rhythm of: NSR EKG non-ischemic   Medicines ordered:  I ordered medication including nitroglycerin for chest pain Reevaluation of the patient after these medicines showed that the patient improved I have reviewed the patients home medicines and have made adjustments as needed   Critical Interventions:     Consults/Attending Physician      Reevaluation:  After the interventions noted above I re-evaluated patient and found that they have :resolved   Social Determinants of Health:      Problem List / ED Course:  Patient with chest pain has been persistent since yesterday evening negative troponins reassuringly nonischemic EKG reassuring workup here did receive nitroglycerin with improvement/resolution of chest  pain I wonder if some of this may be related to hypertension versus potentially patient may have esophageal spasms which respond to nitroglycerin.  Recommend patient follow-up with cardiology.  Provided ambulatory referral.  Will discharge with nitroglycerin and recommend that she go to emergency room if her chest pain persists after 1 dose.  Omeprazole and Carafate  also prescribed to her.  Return precautions discussed.   Dispostion:  After consideration of the diagnostic results and the patients response to treatment, I feel that the patent would benefit from close outpatient follow-up.   Final diagnoses:  Atypical chest pain    ED Discharge Orders          Ordered    sucralfate  (CARAFATE ) 1 g tablet  3 times daily with meals & bedtime        07/27/24 1608    nitroGLYCERIN (NITROSTAT) 0.4 MG SL tablet  Every 5 min PRN        07/27/24 1608    omeprazole (PRILOSEC) 20 MG capsule  Daily        07/27/24 1608    Ambulatory referral to Cardiology        07/27/24 [address] [place], [place] 07/28/24 0052    Neysa Caron PARAS, DO 07/28/24 2301

## 2024-07-27 NOTE — ED Triage Notes (Signed)
 Generalized chest pain, radiates to upper back, shoulders and jaw since last night. Nausea  Denies [facility]

## 2024-07-27 NOTE — Discharge Instructions (Addendum)
 Follow up with cardiology.  Return to ER for new or concerning symptoms as discussed.

## 2024-08-23 ENCOUNTER — Other Ambulatory Visit: Payer: Self-pay

## 2024-08-23 ENCOUNTER — Emergency Department (HOSPITAL_BASED_OUTPATIENT_CLINIC_OR_DEPARTMENT_OTHER)

## 2024-08-23 ENCOUNTER — Emergency Department (HOSPITAL_BASED_OUTPATIENT_CLINIC_OR_DEPARTMENT_OTHER)
Admission: EM | Admit: 2024-08-23 | Discharge: 2024-08-23 | Disposition: A | Discharge status: Home / Self Care | Attending: Emergency Medicine | Admitting: Emergency Medicine

## 2024-08-23 ENCOUNTER — Encounter (HOSPITAL_BASED_OUTPATIENT_CLINIC_OR_DEPARTMENT_OTHER): Payer: Self-pay

## 2024-08-23 DIAGNOSIS — R079 Chest pain, unspecified: Secondary | ICD-10-CM

## 2024-08-23 LAB — CBC
HCT: 41.8 % (ref 36.0–46.0)
Hemoglobin: 14.2 g/dL (ref 12.0–15.0)
MCH: 29.5 pg (ref 26.0–34.0)
MCHC: 34 g/dL (ref 30.0–36.0)
MCV: 86.9 fL (ref 80.0–100.0)
Platelets: 269 K/uL (ref 150–400)
RBC: 4.81 MIL/uL (ref 3.87–5.11)
RDW: 13.3 % (ref 11.5–15.5)
WBC: 7.6 K/uL (ref 4.0–10.5)
nRBC: 0 % (ref 0.0–0.2)

## 2024-08-23 LAB — BASIC METABOLIC PANEL WITH GFR
Anion gap: 12 (ref 5–15)
BUN: 10 mg/dL (ref 6–20)
CO2: 24 mmol/L (ref 22–32)
Calcium: 9.3 mg/dL (ref 8.9–10.3)
Chloride: 101 mmol/L (ref 98–111)
Creatinine, Ser: 0.72 mg/dL (ref 0.44–1.00)
GFR, Estimated: >60 mL/min (ref >60)
Glucose, Bld: 92 mg/dL (ref 70–99)
Potassium: 3.5 mmol/L (ref 3.5–5.1)
Sodium: 137 mmol/L (ref 135–145)

## 2024-08-23 LAB — D-DIMER, QUANTITATIVE: D-Dimer, Quant: 0.47 ug{FEU}/mL (ref 0.00–0.50)

## 2024-08-23 LAB — TROPONIN T, HIGH SENSITIVITY: Troponin T High Sensitivity: <15 ng/L (ref 0–19)

## 2024-08-23 NOTE — ED Provider Notes (Signed)
 [place] [facility] Provider Note   CSN: [id number] Arrival date & time: 08/23/24  9176     Patient presents with: Chest Pain   Felicia Bell is a 53 y.o. female.   53 year old female who presents to the emergency department with chest pain.  Says that this has been a chronic problem for her.  Is seeing cardiology.  Came to the emergency department last month as well for this and had normal EKG and serial troponins.  Says that in the past 3 days the pain has gotten worse.  Radiates from under her left breast to her left upper back and left jaw.  Not exertional.  No diaphoresis or vomiting.  Is pleuritic and occasionally worse with leaning over.  Says that her pain is currently 8/10 in severity after taking 4 mg aspirin.  Does not want additional pain medication now.  Does get short of breath with walking and talking/singing.  Says that she is trying to follow-up with cardiology but her father has been sick which has made this difficult.  No history of CAD.  Had a left heart cath in 2016 that was normal without any significant obstructive disease.  6 days ago had an ultrasound of her leg because she was complaining of leg pain that did not show evidence of DVT.         Prior to Admission medications   Medication Sig Start Date End Date Taking? Authorizing Provider  famotidine  (PEPCID ) 20 MG tablet Take 1 tablet (20 mg total) by mouth 2 (two) times daily. 03/14/23   Geiple, Joshua, PA-C  nitroGLYCERIN  (NITROSTAT ) 0.4 MG SL tablet Place 1 tablet (0.4 mg total) under the tongue every 5 (five) minutes as needed for chest pain. 07/27/24   Neldon Hamp RAMAN, PA  omeprazole  (PRILOSEC) 20 MG capsule Take 1 capsule (20 mg total) by mouth daily. 07/27/24   Neldon Hamp RAMAN, PA  potassium chloride  SA (KLOR-CON ) 20 MEQ tablet Take 1 tablet (20 mEq total) by mouth 2 (two) times daily for 7 days. 05/09/20 05/16/20  Molpus, Norleen, MD  sucralfate  (CARAFATE ) 1 g tablet  Take 1 tablet (1 g total) by mouth 4 (four) times daily -  with meals and at bedtime. 07/27/24 08/26/24  Neldon Hamp RAMAN, PA    Allergies: Morphine    Review of Systems  Updated Vital Signs BP (!) 148/98 (BP Location: Left Arm)   Pulse 90   Temp 97.9 F (36.6 C) (Oral)   Resp 17   Ht 5' (1.524 m)   Wt 77.1 kg   SpO2 100%   BMI 33.20 kg/m   Physical Exam Vitals and nursing note reviewed.  Constitutional:      General: She is not in acute distress.    Appearance: She is well-developed.  HENT:     Head: Normocephalic and atraumatic.     Right Ear: External ear normal.     Left Ear: External ear normal.     Nose: Nose normal.  Eyes:     Extraocular Movements: Extraocular movements intact.     Conjunctiva/sclera: Conjunctivae normal.     Pupils: Pupils are equal, round, and reactive to light.  Cardiovascular:     Rate and Rhythm: Normal rate and regular rhythm.     Heart sounds: No murmur heard.    Comments: Chest pain not reproducible.  Radial pulses 2+ bilaterally. Pulmonary:     Effort: Pulmonary effort is normal. No respiratory distress.  Breath sounds: Normal breath sounds.  Musculoskeletal:     Cervical back: Normal range of motion and neck supple.     Right lower leg: No edema.     Left lower leg: No edema.  Skin:    General: Skin is warm and dry.  Neurological:     Mental Status: She is alert and oriented to person, place, and time. Mental status is at baseline.  Psychiatric:        Mood and Affect: Mood normal.     (all labs ordered are listed, but only abnormal results are displayed) Labs Reviewed  BASIC METABOLIC PANEL WITH GFR  CBC  D-DIMER, QUANTITATIVE  TROPONIN T, HIGH SENSITIVITY    EKG: EKG Interpretation Date/Time:  Tuesday August 23 2024 08:34:37 EST Ventricular Rate:  93 PR Interval:  136 QRS Duration:  73 QT Interval:  358 QTC Calculation: 446 R Axis:   16  Text Interpretation: Sinus rhythm Abnormal R-wave progression, early  transition LVH by voltage Borderline T abnormalities, diffuse leads Confirmed by Yolande Charleston [phone]) on 08/23/2024 8:50:55 AM  Radiology: [facility] Chest 2 View Result Date: 08/23/2024 CLINICAL DATA:  Left-sided chest pain EXAM: CHEST - 2 VIEW COMPARISON:  07/27/2024 FINDINGS: The lungs are clear without focal pneumonia, edema, pneumothorax or pleural effusion. The cardiopericardial silhouette is within normal limits for size. No acute bony abnormality. Telemetry leads overlie the chest. IMPRESSION: No active cardiopulmonary disease. Electronically Signed   By: Camellia Candle M.D.   On: 08/23/2024 09:09     Procedures   Medications Ordered in the ED - No data to display                                  Medical Decision Making Amount and/or Complexity of Data Reviewed Labs: ordered. Radiology: ordered.   Felicia Bell is a 53 y.o. female with comorbidities that complicate the patient evaluation including chronic chest pain who presents to the emergency department with same  Initial Ddx:  MI, PE, pneumonia, dissection, pericarditis, costochondritis, reflux  MDM:  Patient presents emergency department with acute on chronic chest pain.  Has been seen for this multiple times.  Has also followed with cardiology in the past and had a left heart cath that was normal in 2016.  Initially was attributed due to gastroesophageal reflux but after talking to her it appears more pleuritic and not associated with eating.  Did have a recent chest pain workup in the ED that was reassuring.  Was complaining of some leg pain to her PCP so had an ultrasound that did not show evidence of DVT.  With the patient's chest discomfort will obtain EKG and troponins to evaluate for MI.  Also considering pulmonary embolism but patient is not high risk so we will obtain a D-dimer at this time.  Considered dissection but with their symmetric pulses, history, and description of the pain feel it is less likely.  If chest  x-ray reveals widened mediastinum or any other concerning findings will consider CTA.  No infectious symptoms to suggest pneumonia at this time that would be causing pleuritic chest pain.  Possible that it could be pericarditis or costochondritis based on the pleuritic component of her pain but is not reproducible and has not had any other symptoms to suggest this her EKGs that appear to be consistent with pericarditis  Plan:  Labs Troponin D-dimer EKG Chest x-ray  ED Summary/Re-evaluation:  Patient  reassessed.  No new symptoms.  EKG with poor R wave progression and borderline T wave abnormalities that are been present on prior EKGs.  Her high-sensitivity troponin is undetectably low.  Given the fact that she has been having ongoing symptoms for 3 days and her recent reassuring workup in the emergency department do not feel that she needs a repeat troponin at this point in time.  Her D-dimer is within normal limits.  She says that she is having some difficulty scheduling an appointment with cardiology and would be open to following up with: Cardiology so I have placed a referral to see if she needs a repeat ischemic evaluation since has been sometime since she had an angiogram.  Of instructed her to take Tylenol, NSAIDs, and lidocaine patches for her pain in the meantime since I suspect that her pain may be musculoskeletal in nature.  This patient presents to the ED for concern of complaints listed in HPI, this involves an extensive number of treatment options, and is a complaint that carries with it a high risk of complications and morbidity. Disposition including potential need for admission considered.   Dispo: DC Home. Return precautions discussed including, but not limited to, those listed in the AVS. Allowed pt time to ask questions which were answered fully prior to dc.  Records reviewed Outpatient Clinic Notes The following labs were independently interpreted: Chemistry and show no acute  abnormality I independently reviewed the following imaging with scope of interpretation limited to determining acute life threatening conditions related to emergency care: Chest x-ray and agree with the radiologist interpretation with the following exceptions: none I personally reviewed and interpreted cardiac monitoring: normal sinus rhythm  I personally reviewed and interpreted the pt's EKG: see above for interpretation  I have reviewed the patients home medications and made adjustments as needed   Final diagnoses:  Chest pain, unspecified type    ED Discharge Orders          Ordered    Ambulatory referral to Cardiology        08/23/24 0945               Yolande Lamar BROCKS, MD 08/23/24 [phone]

## 2024-08-23 NOTE — ED Notes (Signed)
 Called lab to add d-dimer on to previously collected labs

## 2024-08-23 NOTE — Discharge Instructions (Addendum)
 You were seen for your chest pain in the emergency department.   At home, please take tylenol, ibuprofen, and over the counter lidocaine patches.    Follow-up with your primary doctor in 2-3 days regarding your visit.  Cardiology will be calling you regarding an appointment within the next 72 hours.  You may contact them if you do not hear from them in that time using the information in this packet.  Return immediately to the emergency department if you experience any of the following: Worsening pain, difficulty breathing, unexplained vomiting or sweating, or any other concerning symptoms.    Thank you for visiting our Emergency Department. It was a pleasure taking care of you today.

## 2024-08-23 NOTE — ED Triage Notes (Signed)
 C/o left sided chest pain under breast, upper back pain, jaw pain x 3 days intermittently. Some shortness of breath with talking/singing.   Took 4, 81mg  aspirin this morning.

## 2024-08-23 NOTE — ED Notes (Signed)
 Discharge instructions reviewed with patient. Patient verbalizes understanding, no further questions at this time. Medications and follow up information provided. No acute distress noted at time of departure.

## 2024-09-27 DIAGNOSIS — K449 Diaphragmatic hernia without obstruction or gangrene: Secondary | ICD-10-CM | POA: Insufficient documentation

## 2024-09-27 DIAGNOSIS — N62 Hypertrophy of breast: Secondary | ICD-10-CM | POA: Insufficient documentation

## 2024-09-27 DIAGNOSIS — G629 Polyneuropathy, unspecified: Secondary | ICD-10-CM | POA: Insufficient documentation

## 2024-09-27 DIAGNOSIS — K219 Gastro-esophageal reflux disease without esophagitis: Secondary | ICD-10-CM | POA: Insufficient documentation

## 2024-10-03 ENCOUNTER — Ambulatory Visit
